# Patient Record
Sex: Female | Born: 1985 | Race: Black or African American | Hispanic: No | Marital: Single | Smoking: Former smoker
Health system: Southern US, Community
[De-identification: ages and names within clinical notes are randomized; demographics above are authoritative.]

## PROBLEM LIST (undated history)

## (undated) HISTORY — PX: BREAST CYST ASPIRATION: SHX578

---

## 2005-01-11 ENCOUNTER — Ambulatory Visit: Payer: Self-pay | Admitting: Orthopaedic Surgery

## 2005-10-29 ENCOUNTER — Ambulatory Visit: Payer: Self-pay | Admitting: Family Medicine

## 2006-02-28 ENCOUNTER — Ambulatory Visit: Payer: Self-pay

## 2019-09-07 ENCOUNTER — Other Ambulatory Visit (HOSPITAL_COMMUNITY)
Admission: RE | Admit: 2019-09-07 | Discharge: 2019-09-07 | Disposition: A | Discharge status: Home / Self Care | Payer: 59 | Source: Ambulatory Visit | Attending: Family Medicine | Admitting: Family Medicine

## 2019-09-07 ENCOUNTER — Ambulatory Visit (INDEPENDENT_AMBULATORY_CARE_PROVIDER_SITE_OTHER): Payer: 59 | Admitting: Family Medicine

## 2019-09-07 ENCOUNTER — Other Ambulatory Visit: Payer: Self-pay

## 2019-09-07 ENCOUNTER — Encounter: Payer: Self-pay | Admitting: Family Medicine

## 2019-09-07 VITALS — BP 116/70 | HR 101 | Temp 97.7°F | Resp 14 | Ht 67.75 in | Wt 161.0 lb

## 2019-09-07 DIAGNOSIS — Z113 Encounter for screening for infections with a predominantly sexual mode of transmission: Secondary | ICD-10-CM | POA: Diagnosis not present

## 2019-09-07 DIAGNOSIS — D509 Iron deficiency anemia, unspecified: Secondary | ICD-10-CM | POA: Diagnosis not present

## 2019-09-07 DIAGNOSIS — Z1231 Encounter for screening mammogram for malignant neoplasm of breast: Secondary | ICD-10-CM

## 2019-09-07 DIAGNOSIS — Z803 Family history of malignant neoplasm of breast: Secondary | ICD-10-CM

## 2019-09-07 DIAGNOSIS — Z7689 Persons encountering health services in other specified circumstances: Secondary | ICD-10-CM

## 2019-09-07 MED ORDER — FERROUS SULFATE 220 (44 FE) MG/5ML PO ELIX: 220.0000 mg | ORAL_SOLUTION | Freq: Every day | ORAL | 3 refills | Status: AC

## 2019-09-07 NOTE — Patient Instructions (Signed)
Return one week before your physical to complete labs    Iron-Rich Diet  Iron is a mineral that helps your body to produce hemoglobin. Hemoglobin is a protein in red blood cells that carries oxygen to your body's tissues. Eating too little iron may cause you to feel weak and tired, and it can increase your risk of infection. Iron is naturally found in many foods, and many foods have iron added to them (iron-fortified foods). You may need to follow an iron-rich diet if you do not have enough iron in your body due to certain medical conditions. The amount of iron that you need each day depends on your age, your sex, and any medical conditions you have. Follow instructions from your health care provider or a diet and nutrition specialist (dietitian) about how much iron you should eat each day. What are tips for following this plan? Reading food labels  Check food labels to see how many milligrams (mg) of iron are in each serving. Cooking  Cook foods in pots and pans that are made from iron.  Take these steps to make it easier for your body to absorb iron from certain foods: ? Soak beans overnight before cooking. ? Soak whole grains overnight and drain them before using. ? Ferment flours before baking, such as by using yeast in bread dough. Meal planning  When you eat foods that contain iron, you should eat them with foods that are high in vitamin C. These include oranges, peppers, tomatoes, potatoes, and mango. Vitamin C helps your body to absorb iron. General information  Take iron supplements only as told by your health care provider. An overdose of iron can be life-threatening. If you were prescribed iron supplements, take them with orange juice or a vitamin C supplement.  When you eat iron-fortified foods or take an iron supplement, you should also eat foods that naturally contain iron, such as meat, poultry, and fish. Eating naturally iron-rich foods helps your body to absorb the iron  that is added to other foods or contained in a supplement.  Certain foods and drinks prevent your body from absorbing iron properly. Avoid eating these foods in the same meal as iron-rich foods or with iron supplements. These foods include: ? Coffee, black tea, and red wine. ? Milk, dairy products, and foods that are high in calcium. ? Beans and soybeans. ? Whole grains. What foods should I eat? Fruits Prunes. Raisins. Eat fruits high in vitamin C, such as oranges, grapefruits, and strawberries, alongside iron-rich foods. Vegetables Spinach (cooked). Green peas. Broccoli. Fermented vegetables. Eat vegetables high in vitamin C, such as leafy greens, potatoes, bell peppers, and tomatoes, alongside iron-rich foods. Grains Iron-fortified breakfast cereal. Iron-fortified whole-wheat bread. Enriched rice. Sprouted grains. Meats and other proteins Beef liver. Oysters. Beef. Shrimp. [place]. Chicken. Tuna. Sardines. Chickpeas. Nuts. Tofu. Pumpkin seeds. Beverages Tomato juice. Fresh orange juice. Prune juice. Hibiscus tea. Fortified instant breakfast shakes. Sweets and desserts Blackstrap molasses. Seasonings and condiments Tahini. Fermented soy sauce. Other foods Wheat germ. The items listed above may not be a complete list of recommended foods and beverages. Contact a dietitian for more information. What foods should I avoid? Grains Whole grains. Bran cereal. Bran flour. Oats. Meats and other proteins Soybeans. Products made from soy protein. Black beans. Lentils. Mung beans. Split peas. Dairy Milk. Cream. Cheese. Yogurt. Cottage cheese. Beverages Coffee. Black tea. Red wine. Sweets and desserts Cocoa. Chocolate. Ice cream. Other foods Basil. Oregano. Large amounts of parsley. The items listed above  may not be a complete list of foods and beverages to avoid. Contact a dietitian for more information. Summary  Iron is a mineral that helps your body to produce hemoglobin. Hemoglobin  is a protein in red blood cells that carries oxygen to your body's tissues.  Iron is naturally found in many foods, and many foods have iron added to them (iron-fortified foods).  When you eat foods that contain iron, you should eat them with foods that are high in vitamin C. Vitamin C helps your body to absorb iron.  Certain foods and drinks prevent your body from absorbing iron properly, such as whole grains and dairy products. You should avoid eating these foods in the same meal as iron-rich foods or with iron supplements. This information is not intended to replace advice given to you by your health care provider. Make sure you discuss any questions you have with your health care provider. Document Revised: 06/10/2017 Document Reviewed: 05/24/2017 Elsevier Patient Education  2020 [employer].

## 2019-09-07 NOTE — Progress Notes (Signed)
Name: Tracy Cunningham   MRN: [id number]    DOB: [birth year]   Date:09/07/2019       Progress Note  Chief Complaint  Patient presents with  . New Patient (Initial Visit)     Subjective:   Tracy Cunningham is a 34 y.o. female, presents to clinic to establish care  Otherwise healthy, patient denies any past medical problems   Mom diagnosed with breast CA at 84 years old, some other family members with breast CA - no maternal aunts, but maybe great maternal aunts and some cousins with breast cancer, she states her insurance in the past did not allow for any genetic testing so she is unaware if anybody has any genetic testing or markers.  Last PAP or full physical was maybe 4 years ago - [facility] [place]?  Can't remember where they were located we will try and help her look for the clinic location and see if we can obtain records today.  We did look through my chart together did not see any past Paps or OB/GYN visits. She would like to come back for a physical update her Pap.  Today she is interested in getting some STD testing done. Not currently sexually active, last partner 6 months ago - Wants STD screen no hx of STDs G0P0 She was on birth control for many years but has not been for the past several years.  She states there is no chance of pregnancy She denies any current vaginal discharge, pelvic pain, with her last partner she did not experience any dyspareunia.  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  There are several other females in the family that have similar symptoms and have been told that they are iron deficient.  She denies  any heavy vaginal bleeding, family history of sickle cell or thalassemia that she knows of, denies any known family history of coagulopathy.  She does not have any GI symptoms no diarrhea or food sensitivity no known history of celiac's or absorption problems.   Patient denies any palpitations, chest pain, exertional shortness of breath, cold intolerance, pallor, pica, melena, hematochezia, syncope or near syncope   Patient Active Problem List   Diagnosis Date Noted  . Iron deficiency anemia 09/07/2019  . Family history of breast cancer in female 09/07/2019    History reviewed. No pertinent surgical history.  Family History  Problem Relation Age of Onset  . Breast cancer Mother   . Hypertension Mother   . Diabetes Father   . Hypertension Father   . Asthma Maternal Grandmother   . Alzheimer's disease Maternal Grandfather   . Diabetes Paternal Grandmother     Social History   Tobacco Use  . Smoking status: Former Games developer  . Smokeless tobacco: Never Used  Substance Use Topics  . Alcohol use: Yes  . Drug use: Never      Current Outpatient Medications:  .  Beta Carotene (VITAMIN A) 25000 UNIT capsule, Take 25,000 Units by mouth daily., Disp: , Rfl:  .  ELDERBERRY PO, Take by mouth., Disp: , Rfl:  .  Multiple Vitamin (MULTIVITAMIN) tablet, Take 1 tablet by mouth daily.,  Disp: , Rfl:  .  vitamin E 1000 UNIT capsule, vitamin E, Disp: , Rfl:   No Known Allergies  Chart Review Today: I personally reviewed active problem list, medication list, allergies, family history, social history, health maintenance, notes from last encounter, lab results, imaging with the patient/caregiver today.  Review of Systems  10 Systems reviewed and are negative for acute change except as noted in the HPI.    Objective:    Vitals:   09/07/19 1049  BP: 116/70  Pulse: (!) 101  Resp: 14  Temp: 97.7 F (36.5 C)  SpO2: 99%  Weight: 161 lb (73 kg)  Height: 5' 7.75" (1.721 m)    Body mass  index is 24.66 kg/m.  Physical Exam Vitals and nursing note reviewed.  Constitutional:      General: She is not in acute distress.    Appearance: Normal appearance. She is well-developed. She is not ill-appearing, toxic-appearing or diaphoretic.     Interventions: Face mask in place.  HENT:     Head: Normocephalic and atraumatic.     Right Ear: External ear normal.     Left Ear: External ear normal.  Eyes:     General: Lids are normal. No scleral icterus.       Right eye: No discharge.        Left eye: No discharge.     Conjunctiva/sclera: Conjunctivae normal.  Neck:     Trachea: Phonation normal. No tracheal deviation.  Cardiovascular:     Rate and Rhythm: Normal rate and regular rhythm.     Pulses: Normal pulses.          Radial pulses are 2+ on the right side and 2+ on the left side.       Posterior tibial pulses are 2+ on the right side and 2+ on the left side.     Heart sounds: Normal heart sounds. No murmur. No friction rub. No gallop.   Pulmonary:     Effort: Pulmonary effort is normal. No respiratory distress.     Breath sounds: Normal breath sounds. No stridor. No wheezing, rhonchi or rales.  Chest:     Chest wall: No tenderness.  Abdominal:     General: Bowel sounds are normal. There is no distension.     Palpations: Abdomen is soft.     Tenderness: There is no abdominal tenderness. There is no guarding or rebound.  Musculoskeletal:        General: No deformity. Normal range of motion.     Cervical back: Normal range of motion and neck supple.     Right lower leg: No edema.     Left lower leg: No edema.  Lymphadenopathy:     Cervical: No cervical adenopathy.  Skin:    General: Skin is warm and dry.     Capillary Refill: Capillary refill takes less than 2 seconds.     Coloration: Skin is not jaundiced or pale.     Findings: No rash.  Neurological:     Mental Status: She is alert and oriented to person, place, and time.     Motor: No abnormal muscle tone.      Gait: Gait normal.  Psychiatric:        Mood and Affect: Mood normal.        Speech: Speech normal.        Behavior: Behavior normal.      PHQ2/9: Depression screen [facility] 2/9 09/07/2019  Decreased Interest 0  Down, Depressed, Hopeless  0  PHQ - 2 Score 0  Altered sleeping 0  Tired, decreased energy 1  Change in appetite 0  Feeling bad or failure about yourself  0  Trouble concentrating 0  Moving slowly or fidgety/restless 0  Suicidal thoughts 0  PHQ-9 Score 1  Difficult doing work/chores Not difficult at all    phq 9 is negative, reviewed today  Fall Risk: Fall Risk  09/07/2019  Falls in the past year? 0  Number falls in past yr: 0  Injury with Fall? 0    Functional Status Survey: Is the patient deaf or have difficulty hearing?: No Does the patient have difficulty seeing, even when wearing glasses/contacts?: No Does the patient have difficulty concentrating, remembering, or making decisions?: No Does the patient have difficulty walking or climbing stairs?: No Does the patient have difficulty dressing or bathing?: No Does the patient have difficulty doing errands alone such as visiting a doctor's office or shopping?: No   Assessment & Plan:     ICD-10-CM   1. Family history of breast cancer in female  Z50.3 MM 3D SCREEN BREAST BILATERAL   And mother and multiple other family members on her mother side, like to see if she is eligible for a mammogram now that she is 25 years old, mother got diagnosed at 21 years old -mammogram was ordered and patient was given [place] phone number to schedule  2. Encounter for screening mammogram for malignant neoplasm of breast  Z12.31 MM 3D SCREEN BREAST BILATERAL  3. Screening for STD (sexually transmitted disease)  Z11.3 HIV antibody (with reflex)    RPR    cytology ancillary only   Last partner more than 6 months ago, not currently on birth control, no current symptoms vaginal self swab and blood test done today  4. Iron deficiency  anemia, unspecified iron deficiency anemia type  D50.9 Iron, TIBC and Ferritin Panel    CBC (INCLUDES DIFF/PLT) WITH PATHOLOGIST REVIEW    ferrous sulfate (IRON SUPPLEMENT) 220 (44 Fe) MG/5ML solution   Long personal and family history of iron deficiency per the patient, will obtain labs see if they needs to be a work-up done, no current bleeding, melena, GI sx  5. Encounter to establish care with new doctor  [phone]      Return for CPE in the next 1-2 months and labs done the week before .   Danelle Berry, PA-C 09/07/19 11:07 AM

## 2019-09-10 LAB — CBC (INCLUDES DIFF/PLT) WITH PATHOLOGIST REVIEW
Absolute Monocytes: 420 cells/uL (ref 200–950)
Basophils Absolute: 42 cells/uL (ref 0–200)
Basophils Relative: 1 %
Eosinophils Absolute: 29 cells/uL (ref 15–500)
Eosinophils Relative: 0.7 %
HCT: 37.4 % (ref 35.0–45.0)
Hemoglobin: 12.1 g/dL (ref 11.7–15.5)
Lymphs Abs: 1478 cells/uL (ref 850–3900)
MCH: 27.5 pg (ref 27.0–33.0)
MCHC: 32.4 g/dL (ref 32.0–36.0)
MCV: 85 fL (ref 80.0–100.0)
MPV: 10.8 fL (ref 7.5–12.5)
Monocytes Relative: 10 %
Neutro Abs: 2230 cells/uL (ref 1500–7800)
Neutrophils Relative %: 53.1 %
Platelets: 235 10*3/uL (ref 140–400)
RBC: 4.4 10*6/uL (ref 3.80–5.10)
RDW: 11.5 % (ref 11.0–15.0)
Total Lymphocyte: 35.2 %
WBC: 4.2 10*3/uL (ref 3.8–10.8)

## 2019-09-10 LAB — URINE CYTOLOGY ANCILLARY ONLY
Bacterial Vaginitis (gardnerella): POSITIVE — AB
Candida Glabrata: NEGATIVE
Candida Vaginitis: POSITIVE — AB
Chlamydia: NEGATIVE
Comment: NEGATIVE
Comment: NEGATIVE
Comment: NEGATIVE
Comment: NEGATIVE
Comment: NEGATIVE
Comment: NORMAL
Neisseria Gonorrhea: NEGATIVE
Trichomonas: NEGATIVE

## 2019-09-10 LAB — RPR: RPR Ser Ql: NONREACTIVE

## 2019-09-10 LAB — IRON,TIBC AND FERRITIN PANEL
%SAT: 42 % (calc) (ref 16–45)
Ferritin: 296 ng/mL — ABNORMAL HIGH (ref 16–154)
Iron: 106 ug/dL (ref 40–190)
TIBC: 252 mcg/dL (calc) (ref 250–450)

## 2019-09-10 LAB — HIV ANTIBODY (ROUTINE TESTING W REFLEX): HIV 1&2 Ab, 4th Generation: NONREACTIVE

## 2019-10-02 LAB — RPR: RPR Ser Ql: NONREACTIVE

## 2019-10-02 LAB — HIV ANTIBODY (ROUTINE TESTING W REFLEX): HIV 1&2 Ab, 4th Generation: NONREACTIVE

## 2019-10-05 ENCOUNTER — Encounter: Payer: Self-pay | Admitting: Family Medicine

## 2019-10-09 ENCOUNTER — Other Ambulatory Visit: Payer: Self-pay

## 2019-10-09 ENCOUNTER — Other Ambulatory Visit (HOSPITAL_COMMUNITY)
Admission: RE | Admit: 2019-10-09 | Discharge: 2019-10-09 | Disposition: A | Discharge status: Home / Self Care | Payer: 59 | Source: Ambulatory Visit | Attending: Family Medicine | Admitting: Family Medicine

## 2019-10-09 ENCOUNTER — Ambulatory Visit (INDEPENDENT_AMBULATORY_CARE_PROVIDER_SITE_OTHER): Payer: 59 | Admitting: Family Medicine

## 2019-10-09 ENCOUNTER — Encounter: Payer: Self-pay | Admitting: Family Medicine

## 2019-10-09 VITALS — BP 108/68 | HR 94 | Temp 97.1°F | Resp 16 | Ht 68.0 in | Wt 167.3 lb

## 2019-10-09 DIAGNOSIS — Z124 Encounter for screening for malignant neoplasm of cervix: Secondary | ICD-10-CM | POA: Diagnosis present

## 2019-10-09 DIAGNOSIS — Z803 Family history of malignant neoplasm of breast: Secondary | ICD-10-CM

## 2019-10-09 DIAGNOSIS — Z13228 Encounter for screening for other metabolic disorders: Secondary | ICD-10-CM

## 2019-10-09 DIAGNOSIS — D509 Iron deficiency anemia, unspecified: Secondary | ICD-10-CM

## 2019-10-09 DIAGNOSIS — Z7689 Persons encountering health services in other specified circumstances: Secondary | ICD-10-CM | POA: Diagnosis not present

## 2019-10-09 DIAGNOSIS — Z1329 Encounter for screening for other suspected endocrine disorder: Secondary | ICD-10-CM

## 2019-10-09 DIAGNOSIS — Z130 Encounter for screening for diseases of the blood and blood-forming organs and certain disorders involving the immune mechanism: Secondary | ICD-10-CM

## 2019-10-09 DIAGNOSIS — Z0000 Encounter for general adult medical examination without abnormal findings: Secondary | ICD-10-CM | POA: Diagnosis not present

## 2019-10-09 DIAGNOSIS — Z13220 Encounter for screening for lipoid disorders: Secondary | ICD-10-CM

## 2019-10-09 NOTE — Patient Instructions (Addendum)
Preventive Care 21-36 Years Old, Female Preventive care refers to visits with your health care provider and lifestyle choices that can promote health and wellness. This includes:  A yearly physical exam. This may also be called an annual well check.  Regular dental visits and eye exams.  Immunizations.  Screening for certain conditions.  Healthy lifestyle choices, such as eating a healthy diet, getting regular exercise, not using drugs or products that contain nicotine and tobacco, and limiting alcohol use. What can I expect for my preventive care visit? Physical exam Your health care provider will check your:  Height and weight. This may be used to calculate body mass index (BMI), which tells if you are at a healthy weight.  Heart rate and blood pressure.  Skin for abnormal spots. Counseling Your health care provider may ask you questions about your:  Alcohol, tobacco, and drug use.  Emotional well-being.  Home and relationship well-being.  Sexual activity.  Eating habits.  Work and work environment.  Method of birth control.  Menstrual cycle.  Pregnancy history. What immunizations do I need?  Influenza (flu) vaccine  This is recommended every year. Tetanus, diphtheria, and pertussis (Tdap) vaccine  You may need a Td booster every 10 years. Varicella (chickenpox) vaccine  You may need this if you have not been vaccinated. Human papillomavirus (HPV) vaccine  If recommended by your health care provider, you may need three doses over 6 months. Measles, mumps, and rubella (MMR) vaccine  You may need at least one dose of MMR. You may also need a second dose. Meningococcal conjugate (MenACWY) vaccine  One dose is recommended if you are age 19-21 years and a first-year college student living in a residence hall, or if you have one of several medical conditions. You may also need additional booster doses. Pneumococcal conjugate (PCV13) vaccine  You may need  this if you have certain conditions and were not previously vaccinated. Pneumococcal polysaccharide (PPSV23) vaccine  You may need one or two doses if you smoke cigarettes or if you have certain conditions. Hepatitis A vaccine  You may need this if you have certain conditions or if you travel or work in places where you may be exposed to hepatitis A. Hepatitis B vaccine  You may need this if you have certain conditions or if you travel or work in places where you may be exposed to hepatitis B. Haemophilus influenzae type b (Hib) vaccine  You may need this if you have certain conditions. You may receive vaccines as individual doses or as more than one vaccine together in one shot (combination vaccines). Talk with your health care provider about the risks and benefits of combination vaccines. What tests do I need?  Blood tests  Lipid and cholesterol levels. These may be checked every 5 years starting at age 20.  Hepatitis C test.  Hepatitis B test. Screening  Diabetes screening. This is done by checking your blood sugar (glucose) after you have not eaten for a while (fasting).  Sexually transmitted disease (STD) testing.  BRCA-related cancer screening. This may be done if you have a family history of breast, ovarian, tubal, or peritoneal cancers.  Pelvic exam and Pap test. This may be done every 3 years starting at age 21. Starting at age 30, this may be done every 5 years if you have a Pap test in combination with an HPV test. Talk with your health care provider about your test results, treatment options, and if necessary, the need for more tests.   Follow these instructions at home: Eating and drinking   Eat a diet that includes fresh fruits and vegetables, whole grains, lean protein, and low-fat dairy.  Take vitamin and mineral supplements as recommended by your health care provider.  Do not drink alcohol if: ? Your health care provider tells you not to drink. ? You are  pregnant, may be pregnant, or are planning to become pregnant.  If you drink alcohol: ? Limit how much you have to 0-1 drink a day. ? Be aware of how much alcohol is in your drink. In the [place] one drink equals one 12 oz bottle of beer (355 mL), one 5 oz glass of wine (148 mL), or one 1 oz glass of hard liquor (44 mL). Lifestyle  Take daily care of your teeth and gums.  Stay active. Exercise for at least 30 minutes on 5 or more days each week.  Do not use any products that contain nicotine or tobacco, such as cigarettes, e-cigarettes, and chewing tobacco. If you need help quitting, ask your health care provider.  If you are sexually active, practice safe sex. Use a condom or other form of birth control (contraception) in order to prevent pregnancy and STIs (sexually transmitted infections). If you plan to become pregnant, see your health care provider for a preconception visit. What's next?  Visit your health care provider once a year for a well check visit.  Ask your health care provider how often you should have your eyes and teeth checked.  Stay up to date on all vaccines. This information is not intended to replace advice given to you by your health care provider. Make sure you discuss any questions you have with your health care provider. Document Revised: 03/09/2018 Document Reviewed: 03/09/2018 Elsevier Patient Education  2020 [place] with antiseptic warm soapy water and hydrogen peroxide and apply antibiotic ointment to around the cuticle and if it gets worse come back or go to UC. Hibiclens is a antiseptic soap that I really like  Paronychia Paronychia is an infection of the skin that surrounds a nail. It usually affects the skin around a fingernail, but it may also occur near a toenail. It often causes pain and swelling around the nail. In some cases, a collection of pus (abscess) can form near or under the nail.  This condition may develop suddenly, or it  may develop gradually over a longer period. In most cases, paronychia is not serious, and it will clear up with treatment. What are the causes? This condition may be caused by bacteria or a fungus. These germs can enter the body through an opening in the skin, such as a cut or a hangnail. What increases the risk? This condition is more likely to develop in people who:  Get their hands wet often, such as those who work as Designer, industrial/product, bartenders, or nurses.  Bite their fingernails or suck their thumbs.  Trim their nails very short.  Have hangnails or injured fingertips.  Get manicures.  Have diabetes. What are the signs or symptoms? Symptoms of this condition include:  Redness and swelling of the skin near the nail.  Tenderness around the nail when you touch the area.  Pus-filled bumps under the skin at the base and sides of the nail (cuticle).  Fluid or pus under the nail.  Throbbing pain in the area. How is this diagnosed? This condition is diagnosed with a physical exam. In some cases, a sample of pus may be  tested to determine what type of bacteria or fungus is causing the condition. How is this treated? Treatment depends on the cause and severity of your condition. If your condition is mild, it may clear up on its own in a few days or after soaking in warm water. If needed, treatment may include:  Antibiotic medicine, if your infection is caused by bacteria.  Antifungal medicine, if your infection is caused by a fungus.  A procedure to drain pus from an abscess.  Anti-inflammatory medicine (corticosteroids). Follow these instructions at home: Wound care  Keep the affected area clean.  Soak the affected area in warm water, if told to do so by your health care provider. You may be told to do this for 20 minutes, 2-3 times a day.  Keep the area dry when you are not soaking it.  Do not try to drain an abscess yourself.  Follow instructions from your health care  provider about how to take care of the affected area. Make sure you: ? Wash your hands with soap and water before you change your bandage (dressing). If soap and water are not available, use hand sanitizer. ? Change your dressing as told by your health care provider.  If you had an abscess drained, check the area every day for signs of infection. Check for: ? Redness, swelling, or pain. ? Fluid or blood. ? Warmth. ? Pus or a bad smell. Medicines   Take over-the-counter and prescription medicines only as told by your health care provider.  If you were prescribed an antibiotic medicine, take it as told by your health care provider. Do not stop taking the antibiotic even if you start to feel better. General instructions  Avoid contact with harsh chemicals.  Do not pick at the affected area. Prevention  To prevent this condition from happening again: ? Wear rubber gloves when washing dishes or doing other tasks that require your hands to get wet. ? Wear gloves if your hands might come in contact with cleaners or other chemicals. ? Avoid injuring your nails or fingertips. ? Do not bite your nails or tear hangnails. ? Do not cut your nails very short. ? Do not cut your cuticles. ? Use clean nail clippers or scissors when trimming nails. Contact a health care provider if:  Your symptoms get worse or do not improve with treatment.  You have continued or increased fluid, blood, or pus coming from the affected area.  Your finger or knuckle becomes swollen or difficult to move. Get help right away if you have:  A fever or chills.  Redness spreading away from the affected area.  Joint or muscle pain. Summary  Paronychia is an infection of the skin that surrounds a nail. It often causes pain and swelling around the nail. In some cases, a collection of pus (abscess) can form near or under the nail.  This condition may be caused by bacteria or a fungus. These germs can enter the  body through an opening in the skin, such as a cut or a hangnail.  If your condition is mild, it may clear up on its own in a few days. If needed, treatment may include medicine or a procedure to drain pus from an abscess.  To prevent this condition from happening again, wear gloves if doing tasks that require your hands to get wet or to come in contact with chemicals. Also avoid injuring your nails or fingertips. This information is not intended to replace advice given to  you by your health care provider. Make sure you discuss any questions you have with your health care provider. Document Revised: 07/15/2017 Document Reviewed: 07/11/2017 Elsevier Patient Education  2020 [employer].

## 2019-10-09 NOTE — Progress Notes (Signed)
Patient: Tracy Cunningham, Female    DOB: 12-Sep-1985, 34 y.o.   MRN: [id number] Delsa Grana, PA-C Visit Date: 10/09/2019  Today's Provider: Delsa Grana, PA-C   Chief Complaint  Patient presents with  . Annual Exam    with Pap   Subjective:   Annual physical exam:  Tracy Cunningham is a 34 y.o. female who presents today for complete physical exam:  Exercise/Activity: walking jogging with nice weather she it out every day but minimum 4d a week  Diet/nutrition:  Avoids some carbs Sleep:  Sleeps well   Iron supplement cut in half after initial visit with normal H/H and high  Patient previously had STDs screened and were all negative. She also had lab work done for anemia and today wants to defer lab work since she just had some done and just had Covid vaccinations as well this month  USPSTF grade A and B recommendations - reviewed and addressed today  Depression:  Phq 9 completed today by patient, was reviewed by me with patient in the room PHQ score is neg, pt feels good PHQ 2/9 Scores 10/09/2019 09/07/2019  PHQ - 2 Score 0 0  PHQ- 9 Score 0 1   Depression screen [facility] 2/9 10/09/2019 09/07/2019  Decreased Interest 0 0  Down, Depressed, Hopeless 0 0  PHQ - 2 Score 0 0  Altered sleeping 0 0  Tired, decreased energy 0 1  Change in appetite 0 0  Feeling bad or failure about yourself  0 0  Trouble concentrating 0 0  Moving slowly or fidgety/restless 0 0  Suicidal thoughts 0 0  PHQ-9 Score 0 1  Difficult doing work/chores Not difficult at all Not difficult at all   Alcohol screening:   Office Visit from 10/09/2019 in [facility]  AUDIT-C Score  2     Immunizations and Health Maintenance: Health Maintenance  Topic Date Due  . PAP SMEAR-Modifier  Never done  . INFLUENZA VACCINE  10/10/2019 (Originally 02/10/2019)  . TETANUS/TDAP  09/06/2020 (Originally 05/27/2003)  . HIV Screening  Completed    Hep C Screening:  Not needed STD testing and prevention  (HIV/chl/gon/syphilis):  see above, no additional testing desired by pt today Intimate partner violence: Denies feels safe at home  Sexual History/Pain during Intercourse: Single  Menstrual History/LMP/Abnormal Bleeding:   Regular, started today Patient's last menstrual period was 10/09/2019 (exact date).  Incontinence Symptoms:   none  Breast cancer:  Last Mammogram: *see HM list above - mom BCA at 62, she's scheduled for mammo end of April BRCA gene screening:  Unknown   Cervical cancer screening:  Due for PAP/HPV today for cervical cancer screening  no other known Cancers Pt has no family hx of cancers - breast, ovarian, uterine, colon:     Osteoporosis:   Discussion on osteoporosis per age, including high calcium and vitamin D supplementation, weight bearing exercises Pt is supplementing with daily calcium/Vit D.  Skin cancer:  Hx of skin CA -  NO Discussed atypical lesions   Colorectal cancer:   Colonoscopy is not yet due  Discussed concerning signs and sx of CRC, pt denies melena, hematochezia, constipation  Lung cancer:   Low Dose CT Chest recommended if Age 65-80 years, 30 pack-year currently smoking OR have quit w/in 15years. Patient does not qualify.    Social History   Tobacco Use  . Smoking status: Former Research scientist (life sciences)  . Smokeless tobacco: Never Used  Substance Use Topics  . Alcohol use: Yes  .  Drug use: Never       [facility] Visit from 10/09/2019 in [facility]  AUDIT-C Score  2      Family History  Problem Relation Age of Onset  . Breast cancer Mother   . Hypertension Mother   . Diabetes Father   . Hypertension Father   . Asthma Maternal Grandmother   . Alzheimer's disease Maternal Grandfather   . Diabetes Paternal Grandmother      Blood pressure/Hypertension: BP Readings from Last 3 Encounters:  10/09/19 108/68  09/07/19 116/70    Weight/Obesity: Wt Readings from Last 3 Encounters:  10/09/19 167 lb 4.8 oz (75.9 kg)  09/07/19  161 lb (73 kg)   BMI Readings from Last 3 Encounters:  10/09/19 25.44 kg/m  09/07/19 24.66 kg/m     Lipids:  No results found for: CHOL No results found for: HDL No results found for: LDLCALC No results found for: TRIG No results found for: CHOLHDL No results found for: LDLDIRECT Based on the results of lipid panel his/her cardiovascular risk factor ( using [place] )  in the next 10 years is: The ASCVD Risk score Mikey Bussing DC Jr., et al., 2013) failed to calculate for the following reasons:   The 2013 ASCVD risk score is only valid for ages 37 to 47 Glucose:  No results found for: GLUCOSE, GLUCAP Hypertension: BP Readings from Last 3 Encounters:  10/09/19 108/68  09/07/19 116/70   Obesity: Wt Readings from Last 3 Encounters:  10/09/19 167 lb 4.8 oz (75.9 kg)  09/07/19 161 lb (73 kg)   BMI Readings from Last 3 Encounters:  10/09/19 25.44 kg/m  09/07/19 24.66 kg/m      Advanced Care Planning:  A voluntary discussion about advance care planning including the explanation and discussion of advance directives.   Discussed health care proxy and Living will. Patient does not have a living will at present time.   Social History      She        Social History   Socioeconomic History  . Marital status: Single    Spouse name: Not on file  . Number of children: Not on file  . Years of education: 46  . Highest education level: Master's degree (e.g., MA, MS, MEng, MEd, MSW, MBA)  Occupational History  . Not on file  Tobacco Use  . Smoking status: Former Research scientist (life sciences)  . Smokeless tobacco: Never Used  Substance and Sexual Activity  . Alcohol use: Yes  . Drug use: Never  . Sexual activity: Not Currently  Other Topics Concern  . Not on file  Social History Narrative  . Not on file   Social Determinants of Health   Financial Resource Strain:   . Difficulty of Paying Living Expenses:   Food Insecurity:   . Worried About Charity fundraiser in the Last Year:   . Arts development officer in the Last Year:   Transportation Needs:   . Film/video editor (Medical):   Marland Kitchen Lack of Transportation (Non-Medical):   Physical Activity:   . Days of Exercise per Week:   . Minutes of Exercise per Session:   Stress:   . Feeling of Stress :   Social Connections:   . Frequency of Communication with Friends and Family:   . Frequency of Social Gatherings with Friends and Family:   . Attends Religious Services:   . Active Member of Clubs or Organizations:   . Attends Archivist Meetings:   .  Marital Status:     Family History        Family History  Problem Relation Age of Onset  . Breast cancer Mother   . Hypertension Mother   . Diabetes Father   . Hypertension Father   . Asthma Maternal Grandmother   . Alzheimer's disease Maternal Grandfather   . Diabetes Paternal Grandmother     Patient Active Problem List   Diagnosis Date Noted  . Iron deficiency anemia 09/07/2019  . Family history of breast cancer in female 09/07/2019    History reviewed. No pertinent surgical history.   Current Outpatient Medications:  .  Beta Carotene (VITAMIN A) 25000 UNIT capsule, Take 25,000 Units by mouth daily., Disp: , Rfl:  .  ELDERBERRY PO, Take by mouth., Disp: , Rfl:  .  ferrous sulfate (IRON SUPPLEMENT) 220 (44 Fe) MG/5ML solution, Take 5 mLs (220 mg total) by mouth daily., Disp: 150 mL, Rfl: 3 .  Multiple Vitamin (MULTIVITAMIN) tablet, Take 1 tablet by mouth daily., Disp: , Rfl:  .  vitamin E 1000 UNIT capsule, vitamin E, Disp: , Rfl:   No Known Allergies  Patient Care Team: Delsa Grana, PA-C as PCP - General (Family Medicine)  Review of Systems  Constitutional: Negative.  Negative for activity change, appetite change, fatigue and unexpected weight change.  HENT: Negative.   Eyes: Negative.   Respiratory: Negative.  Negative for shortness of breath.   Cardiovascular: Negative.  Negative for chest pain, palpitations and leg swelling.  Gastrointestinal:  Negative.  Negative for abdominal pain and blood in stool.  Endocrine: Negative.   Genitourinary: Negative.   Musculoskeletal: Negative.  Negative for arthralgias, gait problem, joint swelling and myalgias.  Skin: Negative.  Negative for color change, pallor and rash.  Allergic/Immunologic: Negative.   Neurological: Negative.  Negative for syncope and weakness.  Hematological: Negative.   Psychiatric/Behavioral: Negative.  Negative for confusion, dysphoric mood, self-injury and suicidal ideas. The patient is not nervous/anxious.   All other systems reviewed and are negative.    I personally reviewed active problem list, medication list, allergies, family history, social history, health maintenance, notes from last encounter, lab results, imaging with the patient/caregiver today.       Objective:   Vitals:  Vitals:   10/09/19 0754  BP: 108/68  Pulse: 94  Resp: 16  Temp: (!) 97.1 F (36.2 C)  TempSrc: Temporal  SpO2: 100%  Weight: 167 lb 4.8 oz (75.9 kg)  Height: 5' 8"  (1.727 m)    Body mass index is 25.44 kg/m.  Physical Exam Vitals and nursing note reviewed. Exam conducted with a chaperone present.  Constitutional:      General: She is not in acute distress.    Appearance: Normal appearance. She is well-developed. She is not ill-appearing, toxic-appearing or diaphoretic.     Interventions: Face mask in place.  HENT:     Head: Normocephalic and atraumatic.     Right Ear: External ear normal.     Left Ear: External ear normal.     Nose: Nose normal.     Mouth/Throat:     Pharynx: Uvula midline.  Eyes:     General: Lids are normal. No scleral icterus.       Right eye: No discharge.        Left eye: No discharge.     Conjunctiva/sclera: Conjunctivae normal.     Pupils: Pupils are equal, round, and reactive to light.  Neck:     Trachea: Phonation normal. No tracheal  deviation.  Cardiovascular:     Rate and Rhythm: Normal rate and regular rhythm.     Pulses: Normal  pulses.          Radial pulses are 2+ on the right side and 2+ on the left side.       Posterior tibial pulses are 2+ on the right side and 2+ on the left side.     Heart sounds: Normal heart sounds. No murmur. No friction rub. No gallop.   Pulmonary:     Effort: Pulmonary effort is normal. No respiratory distress.     Breath sounds: Normal breath sounds. No stridor. No wheezing, rhonchi or rales.  Chest:     Chest wall: No mass, lacerations, deformity, swelling, tenderness or crepitus.     Breasts: Breasts are symmetrical.        Right: Normal. No swelling, bleeding, inverted nipple, mass, nipple discharge, skin change or tenderness.        Left: Normal. No swelling, bleeding, inverted nipple, mass, nipple discharge, skin change or tenderness.  Abdominal:     General: Bowel sounds are normal. There is no distension.     Palpations: Abdomen is soft.     Tenderness: There is no abdominal tenderness. There is no guarding or rebound.     Hernia: There is no hernia in the left inguinal area or right inguinal area.  Genitourinary:    Vagina: Normal. No signs of injury. No vaginal discharge, tenderness or lesions.     Cervix: Cervical bleeding present. No cervical motion tenderness, discharge, friability or erythema.     Uterus: Normal.      Adnexa: Right adnexa normal and left adnexa normal.  Musculoskeletal:        General: No deformity. Normal range of motion.     Cervical back: Normal range of motion and neck supple.     Right lower leg: No edema.     Left lower leg: No edema.  Lymphadenopathy:     Cervical: No cervical adenopathy.     Upper Body:     Right upper body: No supraclavicular, axillary or pectoral adenopathy.     Left upper body: No supraclavicular, axillary or pectoral adenopathy.     Lower Body: No right inguinal adenopathy. No left inguinal adenopathy.  Skin:    General: Skin is warm and dry.     Capillary Refill: Capillary refill takes less than 2 seconds.      Coloration: Skin is not jaundiced or pale.     Findings: No rash.  Neurological:     Mental Status: She is alert and oriented to person, place, and time.     Motor: No abnormal muscle tone.     Gait: Gait normal.  Psychiatric:        Speech: Speech normal.        Behavior: Behavior normal.       Fall Risk: Fall Risk  10/09/2019 09/07/2019  Falls in the past year? 0 0  Number falls in past yr: 0 0  Injury with Fall? 0 0    Functional Status Survey: Is the patient deaf or have difficulty hearing?: No Does the patient have difficulty seeing, even when wearing glasses/contacts?: No Does the patient have difficulty concentrating, remembering, or making decisions?: No Does the patient have difficulty walking or climbing stairs?: No Does the patient have difficulty dressing or bathing?: No Does the patient have difficulty doing errands alone such as visiting a doctor's office or shopping?: No  Assessment & Plan:    CPE completed today  . USPSTF grade A and B recommendations reviewed with patient; age-appropriate recommendations, preventive care, screening tests, etc discussed and encouraged; healthy living encouraged; see AVS for patient education given to patient  . Discussed importance of 150 minutes of physical activity weekly, AHA exercise recommendations given to pt in AVS/handout  . Discussed importance of healthy diet:  eating lean meats and proteins, avoiding trans fats and saturated fats, avoid simple sugars and excessive carbs in diet, eat 6 servings of fruit/vegetables daily and drink plenty of water and avoid sweet beverages.    . Recommended pt to do annual eye exam and routine dental exams/cleanings  . Depression, alcohol, fall screening completed as documented above and per flowsheets  . Reviewed Health Maintenance: Health Maintenance  Topic Date Due  . PAP SMEAR-Modifier  Never done  . INFLUENZA VACCINE  10/10/2019 (Originally 02/10/2019)  . TETANUS/TDAP   09/06/2020 (Originally 05/27/2003)  . HIV Screening  Completed    . Immunizations: Immunization History  Administered Date(s) Administered  . PFIZER SARS-COV-2 Vaccination 08/24/2019  Second COVID September 18 2019     ICD-10-CM   1. Adult general medical exam  T94.71 COMPLETE METABOLIC PANEL WITH GFR    Lipid panel    Ferritin    CBC with Differential/Platelet  2. Screening for malignant neoplasm of cervix  Z12.4 Cytology - PAP   PAP done, pt on first day of menses  3. Encounter to establish care with new doctor  Z76.89   4. Screening for endocrine, metabolic and immunity disorder  G52.71 COMPLETE METABOLIC PANEL WITH GFR   Z13.228 Lipid panel   Z13.0   5. Screening for lipoid disorders  S92.909 COMPLETE METABOLIC PANEL WITH GFR    Lipid panel  6. Iron deficiency anemia, unspecified iron deficiency anemia type  D50.9 Ferritin    CBC with Differential/Platelet   on decreased dose of iron, monitor H/H and ferritin  7. Family history of breast cancer in female  Z30.3    mammogram for pt ordered and scheduled, family hx, she was waiting after covid vaccine due to axillary lymphadenopathy - since resolved           Delsa Grana, PA-C 10/09/19 8:20 AM  [facility]

## 2019-10-11 LAB — CYTOLOGY - PAP
Comment: NEGATIVE
Diagnosis: NEGATIVE
High risk HPV: NEGATIVE

## 2019-11-05 ENCOUNTER — Other Ambulatory Visit: Payer: Self-pay

## 2019-11-05 ENCOUNTER — Ambulatory Visit
Admission: RE | Admit: 2019-11-05 | Discharge: 2019-11-05 | Disposition: A | Discharge status: Home / Self Care | Payer: 59 | Source: Ambulatory Visit | Attending: Family Medicine | Admitting: Family Medicine

## 2019-11-05 DIAGNOSIS — Z803 Family history of malignant neoplasm of breast: Secondary | ICD-10-CM | POA: Insufficient documentation

## 2019-11-05 DIAGNOSIS — Z1231 Encounter for screening mammogram for malignant neoplasm of breast: Secondary | ICD-10-CM | POA: Insufficient documentation

## 2019-11-08 ENCOUNTER — Ambulatory Visit: Payer: 59

## 2021-01-05 IMAGING — MG DIGITAL SCREENING BILAT W/ TOMO W/ CAD
6 of 10 series · 6 of 30 positions shown · non-contrast
Comparison: None.

CLINICAL DATA: Screening. Strong family history of breast cancer.
The patient's mother was diagnosed with breast cancer at the age of
40.

EXAM:
DIGITAL SCREENING BILATERAL MAMMOGRAM WITH TOMO AND CAD

[R MLO synth-2D]
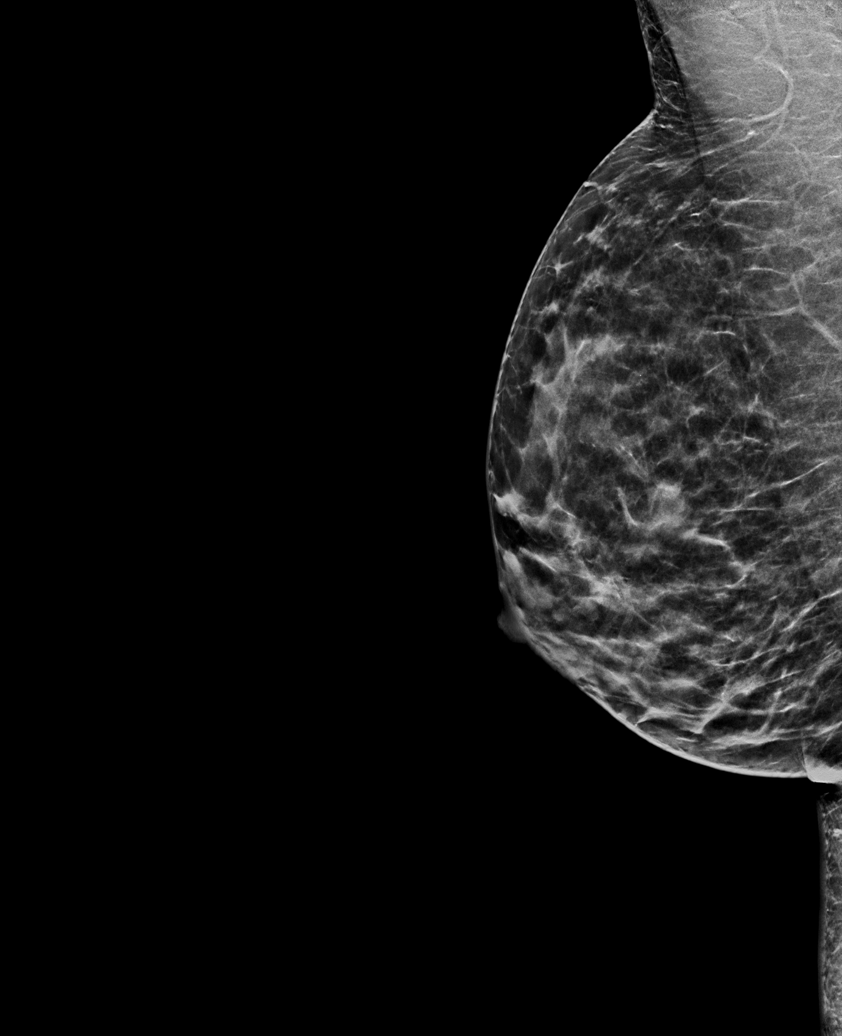

[R CC synth-2D]
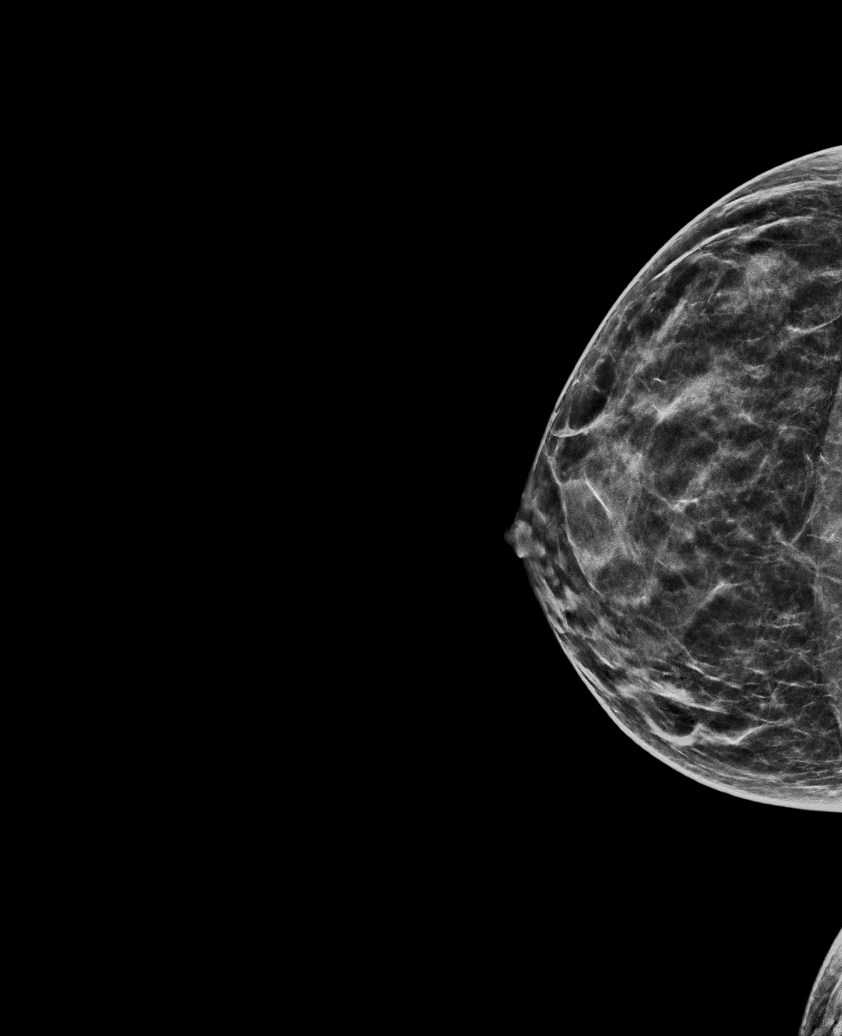

[L CC synth-2D]
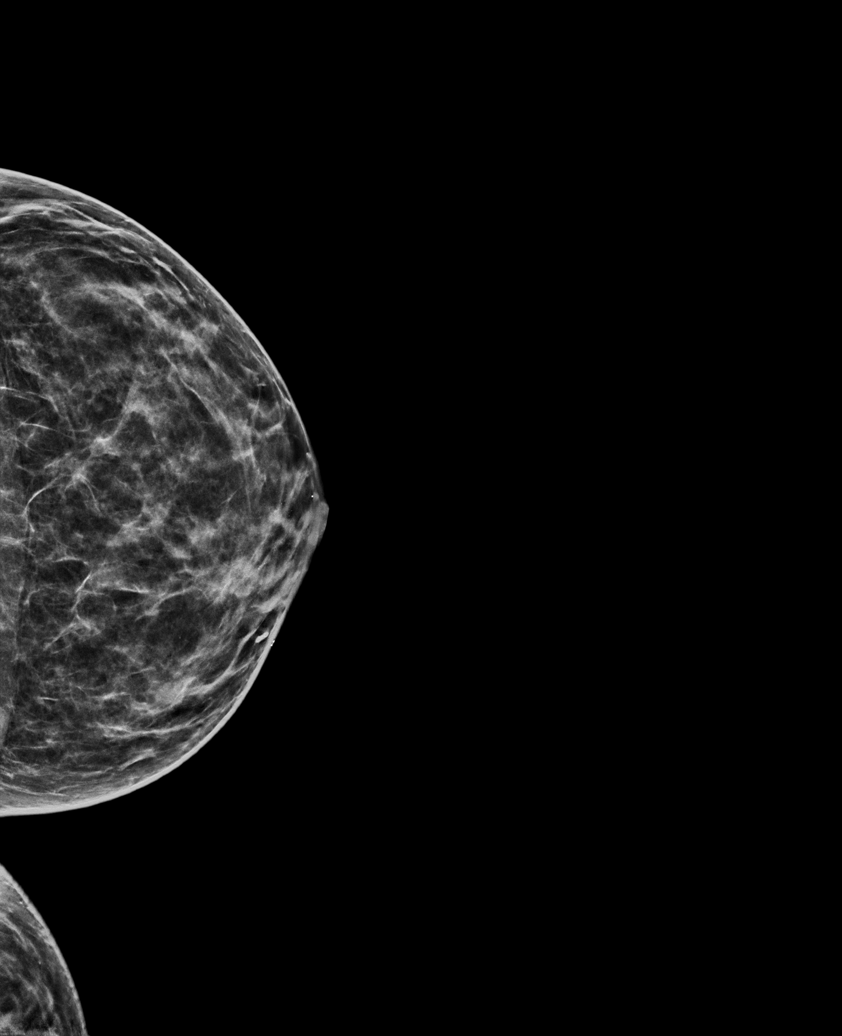

[L MLO synth-2D (1 of 2)]
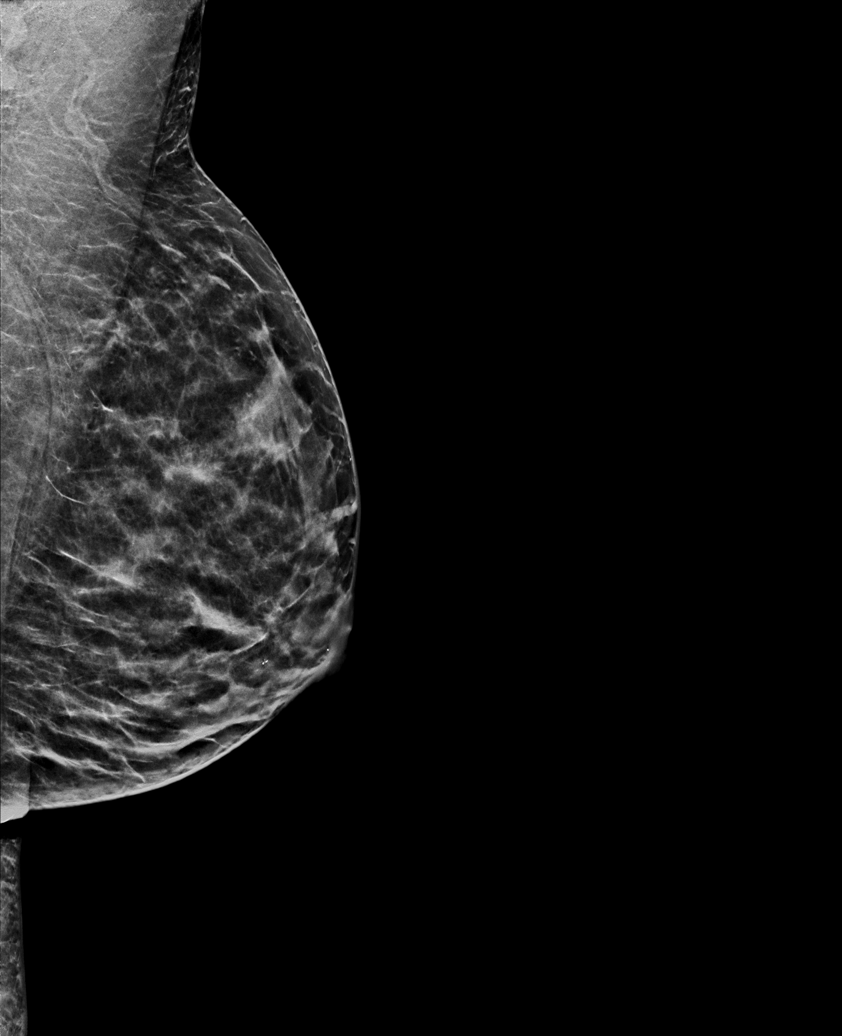

[L MLO synth-2D (2 of 2)]
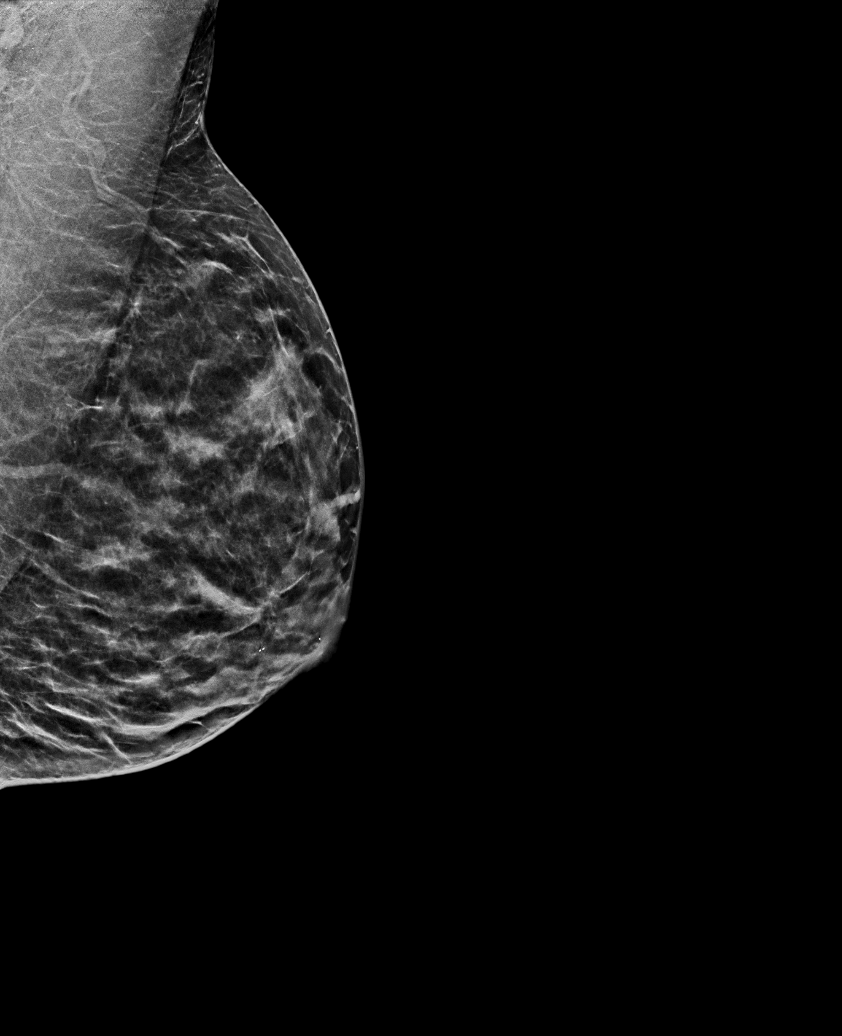

[L MLO tomo · tomo slice 27/52.0]
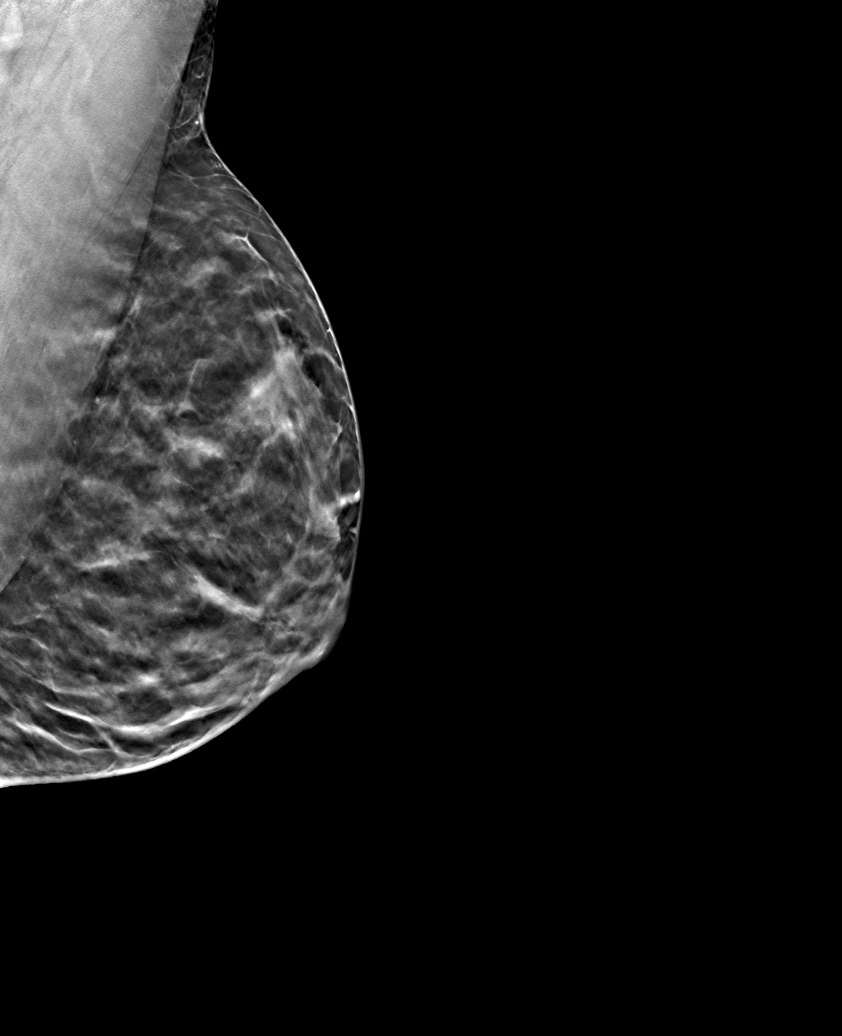

[6 of 30 positions shown; findings below may reference images not displayed]

ACR Breast Density Category c: The breast tissue is heterogeneously
dense, which may obscure small masses
FINDINGS: There are no findings suspicious for malignancy. Images were
processed with CAD.
IMPRESSION: No mammographic evidence of malignancy. A result letter of this
screening mammogram will be mailed directly to the patient.

RECOMMENDATION:
Screening mammogram in 1 year is recommended.

The American Cancer Society recommends annual MRI and mammography in
patients with an estimated lifetime risk of developing breast cancer
greater than 20 - 25%, or who are known or suspected to be positive
for the breast cancer gene.

BI-RADS CATEGORY  1: Negative.

## 2024-02-01 ENCOUNTER — Other Ambulatory Visit: Payer: Self-pay | Admitting: Medical Genetics

## 2024-03-09 ENCOUNTER — Other Ambulatory Visit
Admission: RE | Admit: 2024-03-09 | Discharge: 2024-03-09 | Disposition: A | Discharge status: Home / Self Care | Payer: Self-pay | Source: Ambulatory Visit | Attending: Medical Genetics | Admitting: Medical Genetics

## 2024-03-19 LAB — GENECONNECT MOLECULAR SCREEN: Genetic Analysis Overall Interpretation: NEGATIVE
